# Patient Record
Sex: Female | Born: 1950 | Race: White | Hispanic: No | Smoking: Former smoker
Health system: Southern US, Community
[De-identification: ages and names within clinical notes are randomized; demographics above are authoritative.]

## PROBLEM LIST (undated history)

## (undated) DIAGNOSIS — K5790 Diverticulosis of intestine, part unspecified, without perforation or abscess without bleeding: Secondary | ICD-10-CM

## (undated) HISTORY — DX: Diverticulosis of intestine, part unspecified, without perforation or abscess without bleeding: K57.90

## (undated) HISTORY — PX: TOTAL LAPAROSCOPIC HYSTERECTOMY WITH SALPINGECTOMY: SHX6742

---

## 2009-09-07 ENCOUNTER — Encounter: Admission: RE | Admit: 2009-09-07 | Discharge: 2009-09-07 | Payer: Self-pay | Admitting: Family Medicine

## 2017-05-17 ENCOUNTER — Ambulatory Visit: Payer: BLUE CROSS/BLUE SHIELD | Admitting: Cardiology

## 2017-05-17 NOTE — Progress Notes (Unsigned)
    Cardiology Office Note   Date:  05/17/2017   ID:  Janet Anderson, DOB Jun 05, 1951, MRN [id number]  PCP:  No primary care provider on file.  Cardiologist:   Clayten Allcock [place], MD   No chief complaint on file.     History of Present Illness: Janet Anderson is a 65 y.o. female who is seen at the request of F. Courtney PA for evaluation of palpitations.     No past medical history on file.  *** The histories are not reviewed yet. Please review them in the "History" navigator section and refresh this Colony.   No current outpatient medications on file.   No current facility-administered medications for this visit.     Allergies:   Patient has no allergy information on record.    Social History:  The patient     Family History:  The patient's ***family history is not on file.    ROS:  Please see the history of present illness.   Otherwise, review of systems are positive for {NONE DEFAULTED:18576::"none"}.   All other systems are reviewed and negative.    PHYSICAL EXAM: VS:  There were no vitals taken for this visit. , BMI There is no height or weight on file to calculate BMI. GEN: Well nourished, well developed, in no acute distress  HEENT: normal  Neck: no JVD, carotid bruits, or masses Cardiac: ***RRR; no murmurs, rubs, or gallops,no edema  Respiratory:  clear to auscultation bilaterally, normal work of breathing GI: soft, nontender, nondistended, + BS MS: no deformity or atrophy  Skin: warm and dry, no rash Neuro:  Strength and sensation are intact Psych: euthymic mood, full affect   EKG:  EKG {ACTION; IS/IS YSA:63016010} ordered today. The ekg ordered today demonstrates ***   Recent Labs: No results found for requested labs within last 8760 hours.    Lipid Panel No results found for: CHOL, TRIG, HDL, CHOLHDL, VLDL, LDLCALC, LDLDIRECT    Wt Readings from Last 3 Encounters:  No data found for Wt      Other studies Reviewed: Additional studies/ records  that were reviewed today include: ***. Review of the above records demonstrates: ***   ASSESSMENT AND PLAN:  1.  ***   Current medicines are reviewed at length with the patient today.  The patient {ACTIONS; HAS/DOES NOT HAVE:19233} concerns regarding medicines.  The following changes have been made:  {PLAN; NO CHANGE:13088:s}  Labs/ tests ordered today include: *** No orders of the defined types were placed in this encounter.    Disposition:   FU with *** in {gen number 9-32:[id number]} {Days to years:10300}  Signed, Astin Sayre [place], MD  05/17/2017 7:23 AM    [facility] [address] [place], [place], [address] Phone [phone], Fax [phone]

## 2017-05-18 ENCOUNTER — Ambulatory Visit (INDEPENDENT_AMBULATORY_CARE_PROVIDER_SITE_OTHER): Payer: BLUE CROSS/BLUE SHIELD | Admitting: Cardiology

## 2017-05-18 ENCOUNTER — Encounter: Payer: Self-pay | Admitting: Cardiology

## 2017-05-18 VITALS — BP 128/74 | HR 58 | Ht 66.0 in | Wt 156.4 lb

## 2017-05-18 DIAGNOSIS — R002 Palpitations: Secondary | ICD-10-CM

## 2017-05-18 NOTE — Progress Notes (Signed)
Cardiology Office Note   Date:  05/18/2017   ID:  Janet Anderson, DOB [birth year], MRN [id number]  PCP:  Janet Anderson  Cardiologist:   Janet Ta [place], MD   Chief Complaint  Patient presents with  . Palpitations      History of Present Illness: Janet Anderson is a 66 y.o. female who is seen at the request of Janet Anderson for evaluation of palpitations. She is a healthy 66 yo WF who states that 2 weeks ago she noted the sudden onset of palpitations while hiking. She describes it as a flutter sensation. It is brief. Maybe feels a little sharp pain. Sometimes worse after a meal. Stopped any caffeine one week ago without improvement. Notes symptoms occurring on a daily basis. No dizziness or syncope. No dyspnea. She is an avid Museum/gallery curator and road biker. Works at [employer]. Reports that 8-9 years ago she had something happen while in [place]. She cannot remember details but it sounds like she had a cardiac cath and was told everything was normal. She has no history of HTN, HLD, DM. Quit smoking > 30 yrs ago.    Past Medical History:  Diagnosis Date  . Diverticulosis     Past Surgical History:  Procedure Laterality Date  . TOTAL LAPAROSCOPIC HYSTERECTOMY WITH SALPINGECTOMY       Current Outpatient Medications  Medication Sig Dispense Refill  . aspirin 81 MG EC tablet Take 81 mg by mouth daily.     . Cholecalciferol (VITAMIN D3 PO) Take 1 capsule by mouth daily. Patient do not know dosage (otc supplement)    . Coenzyme Q10 (COQ-10 PO) Take 1 tablet by mouth daily. Patient do not know dosage (otc supplement)    . GINKGO BILOBA PO Take 1 tablet by mouth daily. Patient do not know dosage (otc supplement)    . Omega-3 Fatty Acids (FISH OIL PO) Take 2 capsules by mouth 2 (two) times daily. Patient do not know dosage (otc supplement)     No current facility-administered medications for this visit.     Allergies:   Patient has no allergy information on  record.    Social History:  The patient  reports that she quit smoking about 30 years ago. she has never used smokeless tobacco. She reports that she drinks about 0.6 oz of alcohol per week. She reports that she does not use drugs.   Family History:  The patient's family history includes Emphysema in her mother; Stroke in her sister; Throat cancer in her father.    ROS:  Please see the history of present illness.   Otherwise, review of systems are positive for none.   All other systems are reviewed and negative.    PHYSICAL EXAM: VS:  BP 128/74   Pulse (!) 58   Ht 5\' 6"  (1.676 m)   Wt 156 lb 6.4 oz (70.9 kg)   SpO2 96%   BMI 25.24 kg/m  , BMI Body mass index is 25.24 kg/m. GEN: Well nourished, well developed, in no acute distress  HEENT: normal  Neck: no JVD, carotid bruits, or masses Cardiac: RRR; no murmurs, rubs, or gallops,no edema  Respiratory:  clear to auscultation bilaterally, normal work of breathing GI: soft, nontender, nondistended, + BS MS: no deformity or atrophy  Skin: warm and dry, no rash Neuro:  Strength and sensation are intact Psych: euthymic mood, full affect   EKG:  EKG is ordered today. The ekg ordered today demonstrates  NSR rate 57. Normal. I have personally reviewed and interpreted this study.    Recent Labs: No results found for requested labs within last 8760 hours.    Lipid Panel No results found for: CHOL, TRIG, HDL, CHOLHDL, VLDL, LDLCALC, LDLDIRECT    Wt Readings from Last 3 Encounters:  05/18/17 156 lb 6.4 oz (70.9 kg)      Other studies Reviewed: Additional studies/ records that were reviewed today include:  Labs dated 05/08/17: normal CMET and CBC   ASSESSMENT AND PLAN:  1.  Palpitations. None noted on exam or Ecg today but describes symptoms on a daily basis. Will arrange for a 48 hour Holter monitor. If benign will reassure.    Current medicines are reviewed at length with the patient today.  The patient does not have  concerns regarding medicines.  The following changes have been made:  no change  Labs/ tests ordered today include: 48 hour Holter monitor. No orders of the defined types were placed in this encounter.    Disposition:   FU with me TBD  Signed, Janet Shasteen Martin[place], MD  05/18/2017 5:27 PM    [facility] [address], [place], [place], [address] Phone [phone], Fax [phone]

## 2017-05-18 NOTE — Addendum Note (Signed)
Addended by: Kathyrn Lass on: 05/18/2017 05:58 PM   Modules accepted: Orders

## 2017-05-18 NOTE — Patient Instructions (Signed)
We will have you wear a monitor for 48 hours

## 2017-06-07 ENCOUNTER — Ambulatory Visit (INDEPENDENT_AMBULATORY_CARE_PROVIDER_SITE_OTHER): Payer: BLUE CROSS/BLUE SHIELD

## 2017-06-07 DIAGNOSIS — R002 Palpitations: Secondary | ICD-10-CM | POA: Diagnosis not present

## 2018-01-09 DIAGNOSIS — H5210 Myopia, unspecified eye: Secondary | ICD-10-CM | POA: Diagnosis not present

## 2018-01-09 DIAGNOSIS — Z0100 Encounter for examination of eyes and vision without abnormal findings: Secondary | ICD-10-CM | POA: Diagnosis not present

## 2018-01-29 DIAGNOSIS — R51 Headache: Secondary | ICD-10-CM | POA: Diagnosis not present

## 2018-01-29 DIAGNOSIS — M25512 Pain in left shoulder: Secondary | ICD-10-CM | POA: Diagnosis not present

## 2018-01-29 DIAGNOSIS — M79659 Pain in unspecified thigh: Secondary | ICD-10-CM | POA: Diagnosis not present

## 2018-02-07 DIAGNOSIS — R69 Illness, unspecified: Secondary | ICD-10-CM | POA: Diagnosis not present

## 2018-03-21 DIAGNOSIS — R69 Illness, unspecified: Secondary | ICD-10-CM | POA: Diagnosis not present

## 2018-08-15 DIAGNOSIS — R69 Illness, unspecified: Secondary | ICD-10-CM | POA: Diagnosis not present

## 2018-08-29 DIAGNOSIS — R69 Illness, unspecified: Secondary | ICD-10-CM | POA: Diagnosis not present

## 2018-11-28 DIAGNOSIS — L508 Other urticaria: Secondary | ICD-10-CM | POA: Diagnosis not present

## 2018-11-28 DIAGNOSIS — L299 Pruritus, unspecified: Secondary | ICD-10-CM | POA: Diagnosis not present

## 2018-11-28 DIAGNOSIS — L0100 Impetigo, unspecified: Secondary | ICD-10-CM | POA: Diagnosis not present

## 2019-02-12 DIAGNOSIS — R69 Illness, unspecified: Secondary | ICD-10-CM | POA: Diagnosis not present

## 2019-04-29 DIAGNOSIS — R69 Illness, unspecified: Secondary | ICD-10-CM | POA: Diagnosis not present

## 2019-05-05 DIAGNOSIS — R69 Illness, unspecified: Secondary | ICD-10-CM | POA: Diagnosis not present

## 2019-05-09 DIAGNOSIS — Z5320 Procedure and treatment not carried out because of patient's decision for unspecified reasons: Secondary | ICD-10-CM | POA: Diagnosis not present

## 2019-05-09 DIAGNOSIS — Z0000 Encounter for general adult medical examination without abnormal findings: Secondary | ICD-10-CM | POA: Diagnosis not present

## 2019-09-24 ENCOUNTER — Other Ambulatory Visit: Payer: Self-pay | Admitting: Family Medicine

## 2019-10-22 DIAGNOSIS — R69 Illness, unspecified: Secondary | ICD-10-CM | POA: Diagnosis not present

## 2019-11-26 DIAGNOSIS — R69 Illness, unspecified: Secondary | ICD-10-CM | POA: Diagnosis not present

## 2020-02-05 DIAGNOSIS — R69 Illness, unspecified: Secondary | ICD-10-CM | POA: Diagnosis not present

## 2020-03-01 DIAGNOSIS — C7A8 Other malignant neuroendocrine tumors: Secondary | ICD-10-CM | POA: Diagnosis not present

## 2020-03-01 DIAGNOSIS — C4A30 Merkel cell carcinoma of unspecified part of face: Secondary | ICD-10-CM | POA: Diagnosis not present

## 2020-03-01 DIAGNOSIS — C44222 Squamous cell carcinoma of skin of right ear and external auricular canal: Secondary | ICD-10-CM | POA: Diagnosis not present

## 2020-03-09 DIAGNOSIS — Z85828 Personal history of other malignant neoplasm of skin: Secondary | ICD-10-CM | POA: Diagnosis not present

## 2020-04-01 DIAGNOSIS — C4A31 Merkel cell carcinoma of nose: Secondary | ICD-10-CM | POA: Diagnosis not present

## 2020-04-22 DIAGNOSIS — H40023 Open angle with borderline findings, high risk, bilateral: Secondary | ICD-10-CM | POA: Diagnosis not present

## 2020-04-22 DIAGNOSIS — H2513 Age-related nuclear cataract, bilateral: Secondary | ICD-10-CM | POA: Diagnosis not present

## 2020-04-22 DIAGNOSIS — Z0100 Encounter for examination of eyes and vision without abnormal findings: Secondary | ICD-10-CM | POA: Diagnosis not present

## 2020-04-22 DIAGNOSIS — H5200 Hypermetropia, unspecified eye: Secondary | ICD-10-CM | POA: Diagnosis not present

## 2020-05-24 DIAGNOSIS — Z0000 Encounter for general adult medical examination without abnormal findings: Secondary | ICD-10-CM | POA: Diagnosis not present

## 2020-05-24 DIAGNOSIS — Z136 Encounter for screening for cardiovascular disorders: Secondary | ICD-10-CM | POA: Diagnosis not present

## 2020-05-25 ENCOUNTER — Other Ambulatory Visit: Payer: Self-pay | Admitting: Family Medicine

## 2020-05-25 DIAGNOSIS — Z1231 Encounter for screening mammogram for malignant neoplasm of breast: Secondary | ICD-10-CM

## 2020-06-29 DIAGNOSIS — H40013 Open angle with borderline findings, low risk, bilateral: Secondary | ICD-10-CM | POA: Diagnosis not present

## 2020-06-29 DIAGNOSIS — H02886 Meibomian gland dysfunction of left eye, unspecified eyelid: Secondary | ICD-10-CM | POA: Diagnosis not present

## 2020-06-29 DIAGNOSIS — H02883 Meibomian gland dysfunction of right eye, unspecified eyelid: Secondary | ICD-10-CM | POA: Diagnosis not present

## 2020-06-29 DIAGNOSIS — H2513 Age-related nuclear cataract, bilateral: Secondary | ICD-10-CM | POA: Diagnosis not present

## 2020-10-14 DIAGNOSIS — Z23 Encounter for immunization: Secondary | ICD-10-CM | POA: Diagnosis not present

## 2021-01-08 DIAGNOSIS — J0110 Acute frontal sinusitis, unspecified: Secondary | ICD-10-CM | POA: Diagnosis not present

## 2021-01-08 DIAGNOSIS — R5383 Other fatigue: Secondary | ICD-10-CM | POA: Diagnosis not present

## 2021-01-08 DIAGNOSIS — R519 Headache, unspecified: Secondary | ICD-10-CM | POA: Diagnosis not present

## 2021-01-16 DIAGNOSIS — R197 Diarrhea, unspecified: Secondary | ICD-10-CM | POA: Diagnosis not present

## 2021-01-25 DIAGNOSIS — R197 Diarrhea, unspecified: Secondary | ICD-10-CM | POA: Diagnosis not present

## 2021-01-25 DIAGNOSIS — K6289 Other specified diseases of anus and rectum: Secondary | ICD-10-CM | POA: Diagnosis not present

## 2021-02-03 ENCOUNTER — Other Ambulatory Visit: Payer: Self-pay | Admitting: Physician Assistant

## 2021-02-03 ENCOUNTER — Other Ambulatory Visit (HOSPITAL_COMMUNITY): Payer: Self-pay | Admitting: Physician Assistant

## 2021-02-03 DIAGNOSIS — R6889 Other general symptoms and signs: Secondary | ICD-10-CM

## 2021-02-03 DIAGNOSIS — R194 Change in bowel habit: Secondary | ICD-10-CM | POA: Diagnosis not present

## 2021-02-03 DIAGNOSIS — K6289 Other specified diseases of anus and rectum: Secondary | ICD-10-CM | POA: Diagnosis not present

## 2021-02-07 ENCOUNTER — Ambulatory Visit
Admission: RE | Admit: 2021-02-07 | Discharge: 2021-02-07 | Disposition: A | Discharge status: Home / Self Care | Payer: Medicare HMO | Source: Ambulatory Visit | Attending: Physician Assistant | Admitting: Physician Assistant

## 2021-02-07 DIAGNOSIS — K5730 Diverticulosis of large intestine without perforation or abscess without bleeding: Secondary | ICD-10-CM | POA: Diagnosis not present

## 2021-02-07 DIAGNOSIS — N3289 Other specified disorders of bladder: Secondary | ICD-10-CM | POA: Diagnosis not present

## 2021-02-07 DIAGNOSIS — K6289 Other specified diseases of anus and rectum: Secondary | ICD-10-CM

## 2021-02-07 DIAGNOSIS — R6889 Other general symptoms and signs: Secondary | ICD-10-CM

## 2021-02-07 DIAGNOSIS — K828 Other specified diseases of gallbladder: Secondary | ICD-10-CM | POA: Diagnosis not present

## 2021-02-07 MED ORDER — IOPAMIDOL (ISOVUE-300) INJECTION 61%
100.0000 mL | Freq: Once | INTRAVENOUS | Status: AC | PRN
  Administered 2021-02-07: 100 mL via INTRAVENOUS

## 2021-02-28 DIAGNOSIS — R933 Abnormal findings on diagnostic imaging of other parts of digestive tract: Secondary | ICD-10-CM | POA: Diagnosis not present

## 2021-02-28 DIAGNOSIS — K625 Hemorrhage of anus and rectum: Secondary | ICD-10-CM | POA: Diagnosis not present

## 2021-02-28 DIAGNOSIS — K648 Other hemorrhoids: Secondary | ICD-10-CM | POA: Diagnosis not present

## 2021-02-28 DIAGNOSIS — K621 Rectal polyp: Secondary | ICD-10-CM | POA: Diagnosis not present

## 2021-02-28 DIAGNOSIS — K644 Residual hemorrhoidal skin tags: Secondary | ICD-10-CM | POA: Diagnosis not present

## 2021-02-28 DIAGNOSIS — K6289 Other specified diseases of anus and rectum: Secondary | ICD-10-CM | POA: Diagnosis not present

## 2021-02-28 DIAGNOSIS — K5140 Inflammatory polyps of colon without complications: Secondary | ICD-10-CM | POA: Diagnosis not present

## 2021-02-28 DIAGNOSIS — K5730 Diverticulosis of large intestine without perforation or abscess without bleeding: Secondary | ICD-10-CM | POA: Diagnosis not present

## 2021-03-04 DIAGNOSIS — K621 Rectal polyp: Secondary | ICD-10-CM | POA: Diagnosis not present

## 2021-03-04 DIAGNOSIS — K5140 Inflammatory polyps of colon without complications: Secondary | ICD-10-CM | POA: Diagnosis not present

## 2021-04-07 DIAGNOSIS — Z23 Encounter for immunization: Secondary | ICD-10-CM | POA: Diagnosis not present

## 2021-04-16 DIAGNOSIS — W540XXA Bitten by dog, initial encounter: Secondary | ICD-10-CM | POA: Diagnosis not present

## 2021-04-16 DIAGNOSIS — M79661 Pain in right lower leg: Secondary | ICD-10-CM | POA: Diagnosis not present

## 2021-04-16 DIAGNOSIS — Y9355 Activity, bike riding: Secondary | ICD-10-CM | POA: Diagnosis not present

## 2021-04-16 DIAGNOSIS — S81851A Open bite, right lower leg, initial encounter: Secondary | ICD-10-CM | POA: Diagnosis not present

## 2021-04-16 DIAGNOSIS — E785 Hyperlipidemia, unspecified: Secondary | ICD-10-CM | POA: Diagnosis not present

## 2021-04-16 DIAGNOSIS — Y92413 State road as the place of occurrence of the external cause: Secondary | ICD-10-CM | POA: Diagnosis not present

## 2022-09-29 IMAGING — CT CT ABD-PELV W/ CM
2 of 5 series · 16 of 46 positions shown, 18 images · IV contrast (iopamidol)
Comparison: None.

CLINICAL DATA: Rectal pain. Abnormal digital rectal exam. History
of hysterectomy.

EXAM:
CT ABDOMEN AND PELVIS WITH CONTRAST
TECHNIQUE: Multidetector CT imaging of the abdomen and pelvis was performed
using the standard protocol following bolus administration of
intravenous contrast.
CONTRAST:  100mL I021UI-QBB IOPAMIDOL (I021UI-QBB) INJECTION 61%

[Series 2: abd pelvis 5.00 br40 s3 axial · axial · 0.71mm/px · z∈[+1187,+1582]mm · 13 of 89 slices shown, 15 images]
[im 5/89  soft-tissue]
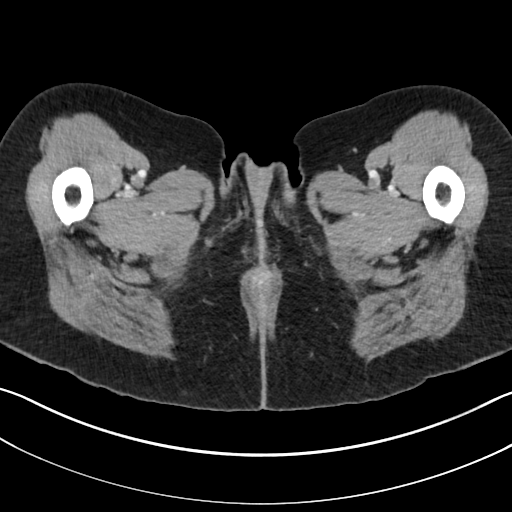
[im 5/89  bone]
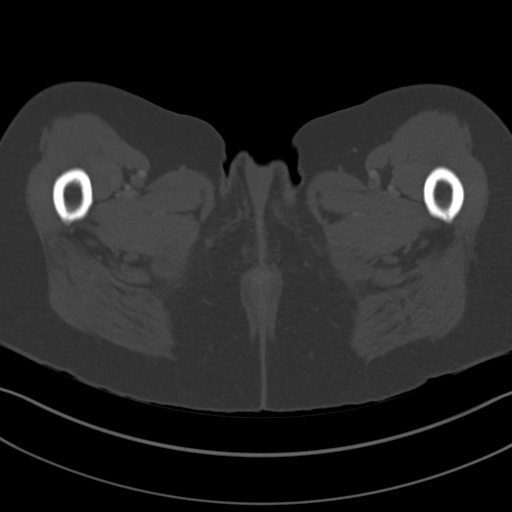
[im 10/89  soft-tissue]
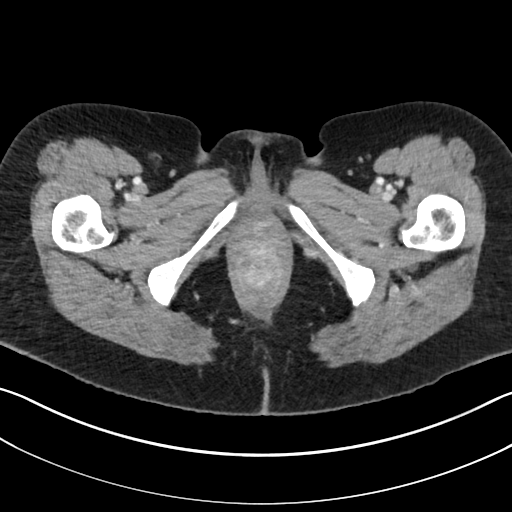
[im 20/89  soft-tissue]
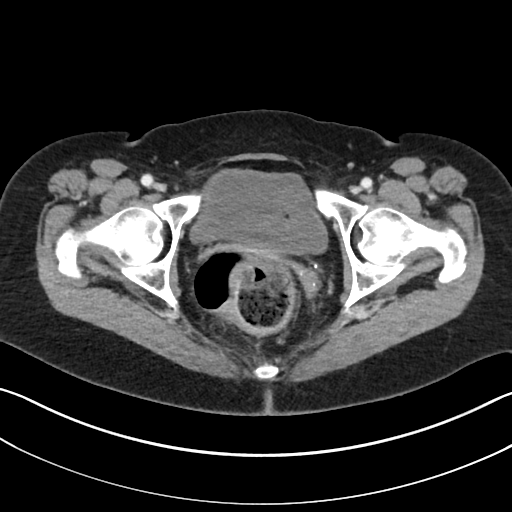
[im 25/89  soft-tissue]
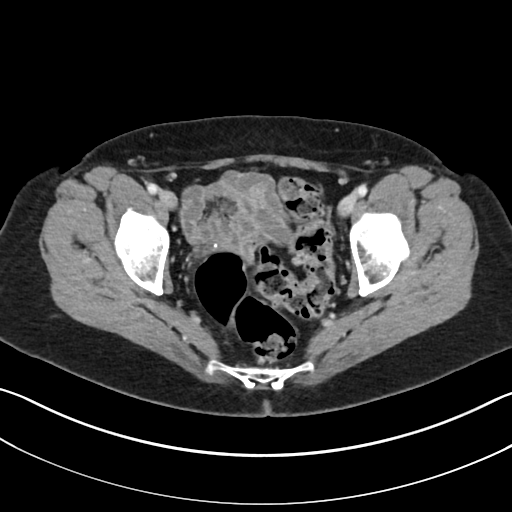
[im 30/89  soft-tissue]
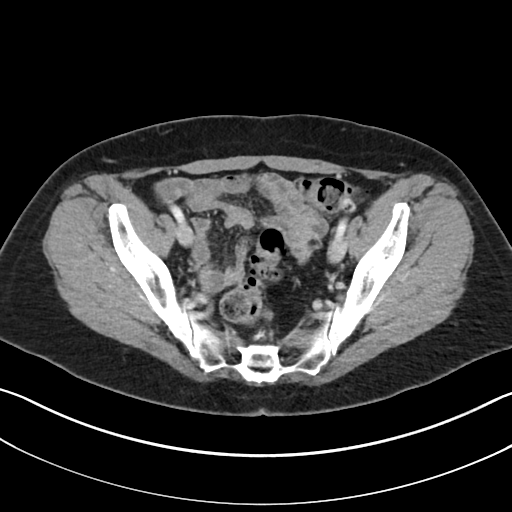
[im 40/89  soft-tissue]
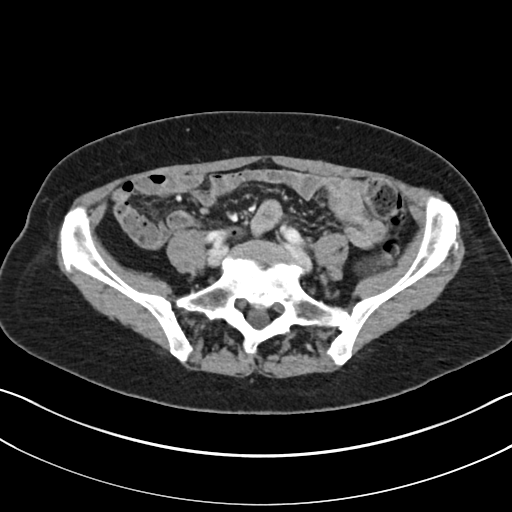
[im 45/89  soft-tissue]
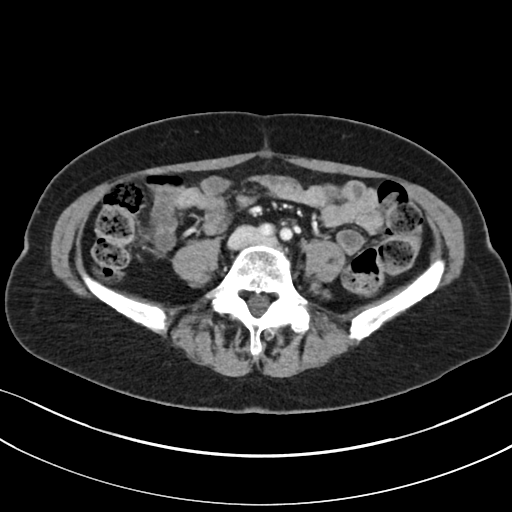
[im 49/89  soft-tissue]
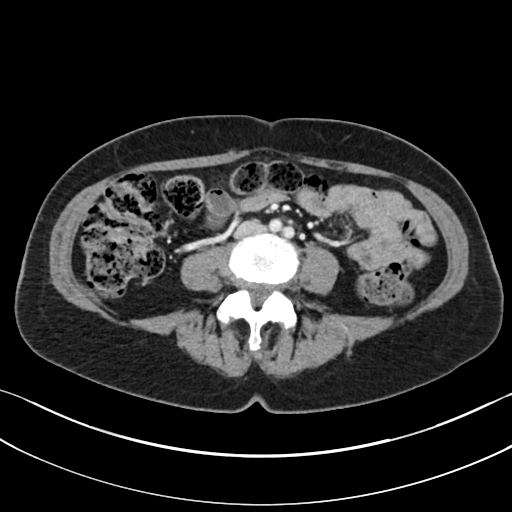
[im 59/89  soft-tissue]
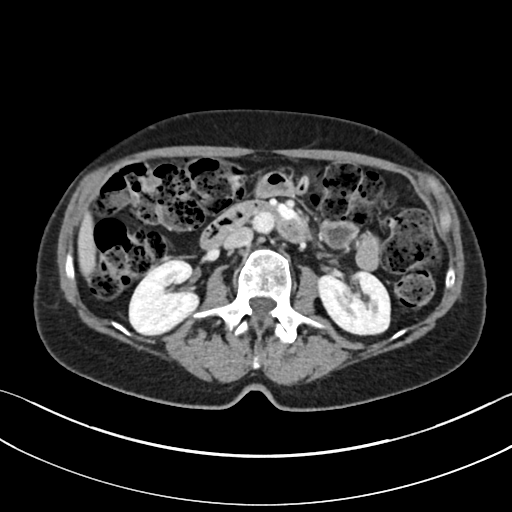
[im 59/89  bone]
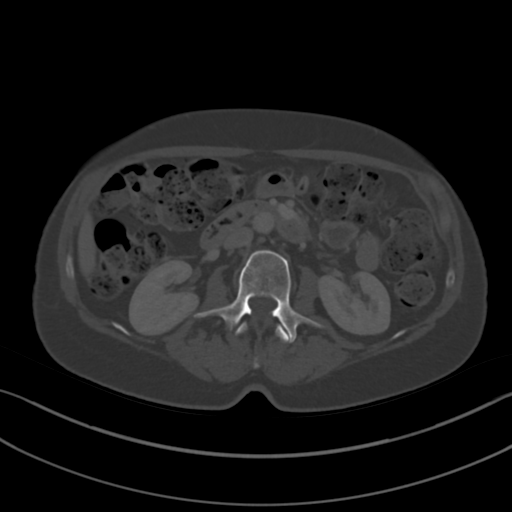
[im 64/89  soft-tissue]
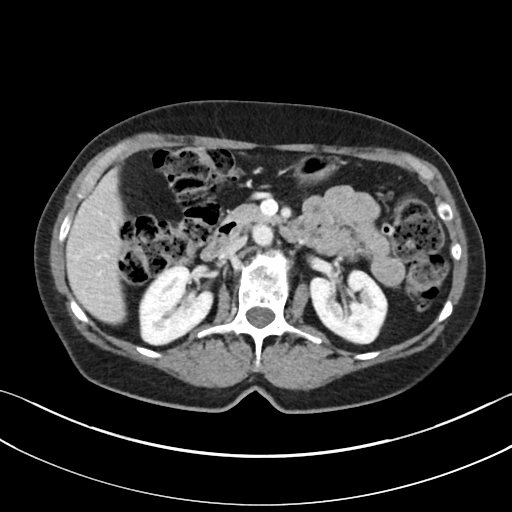
[im 69/89  soft-tissue]
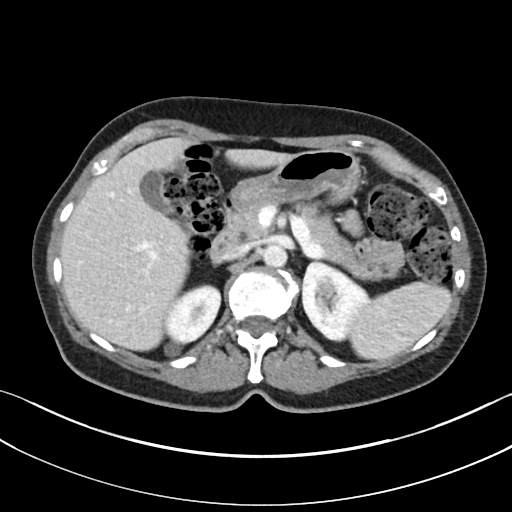
[im 79/89  soft-tissue]
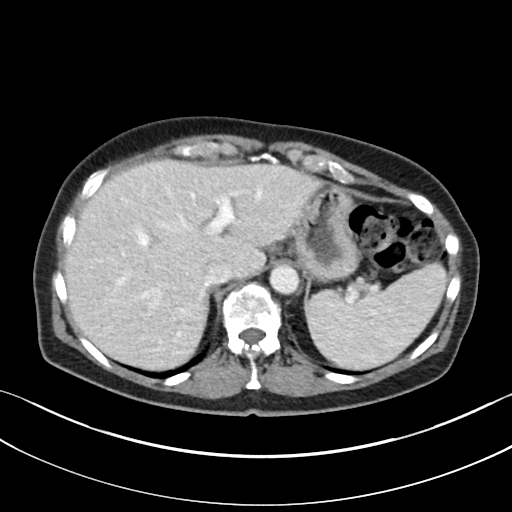
[im 84/89  soft-tissue]
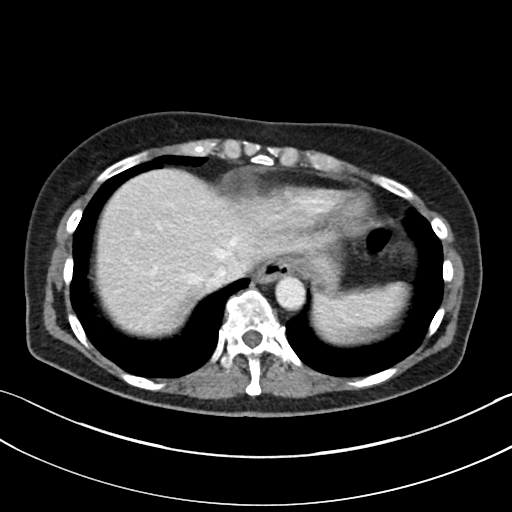

[Series 6: abd pelvis 2.00 br40 s3 cor · coronal · 0.71mm/px · 3 of 177 slices shown]
[im 59/177  soft-tissue]
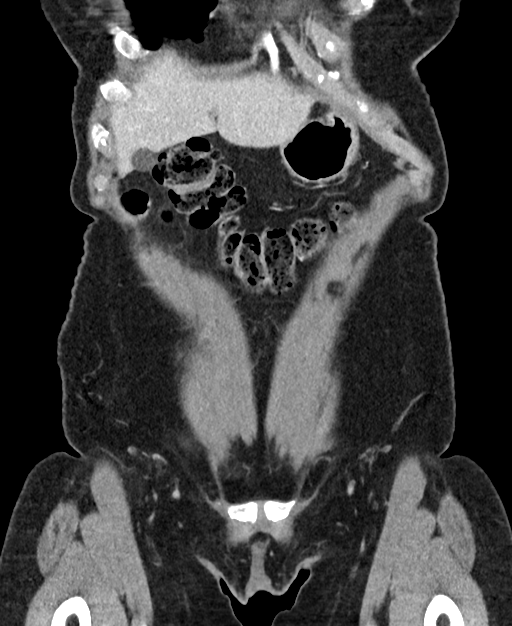
[im 79/177  soft-tissue]
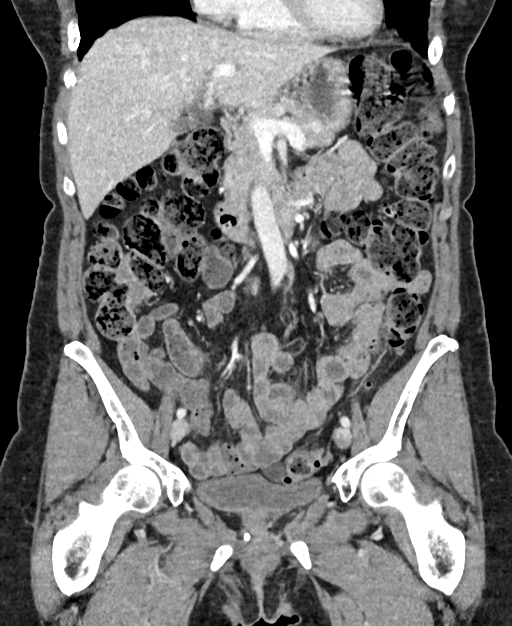
[im 98/177  soft-tissue]
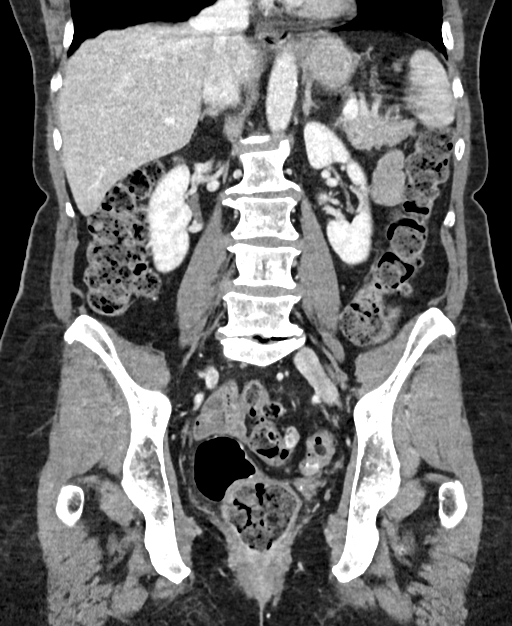

[16 of 46 positions shown; findings below may reference images not displayed]

FINDINGS: Lower chest: Normal

Hepatobiliary: 18 mm cyst within the right lobe adjacent to the
gallbladder fossa. Liver appears otherwise normal. No gallbladder
abnormality by CT.

Pancreas: Normal

Spleen: Normal

Adrenals/Urinary Tract: Adrenal glands are normal. Kidneys are
normal except for an insignificant 1 cm cyst in the midportion of
the right kidney. No stone or obstruction. The bladder appears
normal. 2 cm cyst just superior to the bladder, probably not
significant.

Stomach/Bowel: Stomach and small intestine are normal. There is a
large amount of fecal matter throughout the colon. There is
diverticulosis of the colon without evidence of diverticulitis.
There is stool within the rectum. I do not see a discernible mass of
the colon or at the rectum by CT. One could question if there was
some edema at the anus that could go along with proctitis.

Vascular/Lymphatic: No lymphadenopathy.  Aorta and IVC are normal.

Reproductive: Previous hysterectomy.  No pelvic mass.

Other: No free fluid or air.

Musculoskeletal: Ordinary lumbar degenerative change.
IMPRESSION: Large amount of fecal matter within the colon, including within the
rectum. One could question some edema in the region of the anus that
could go along with proctitis.

Diverticulosis of the colon but no CT evidence of diverticulitis.
